# Patient Record
Sex: Female | Born: 1955 | Race: Black or African American | Hispanic: No | Marital: Single | Smoking: Current every day smoker
Health system: Southern US, Community
[De-identification: ages and names within clinical notes are randomized; demographics above are authoritative.]

## PROBLEM LIST (undated history)

## (undated) DIAGNOSIS — I10 Essential (primary) hypertension: Secondary | ICD-10-CM

## (undated) DIAGNOSIS — E119 Type 2 diabetes mellitus without complications: Secondary | ICD-10-CM

---

## 2021-06-13 ENCOUNTER — Other Ambulatory Visit: Payer: Self-pay

## 2021-06-13 ENCOUNTER — Emergency Department (HOSPITAL_BASED_OUTPATIENT_CLINIC_OR_DEPARTMENT_OTHER): Payer: Self-pay

## 2021-06-13 ENCOUNTER — Encounter (HOSPITAL_BASED_OUTPATIENT_CLINIC_OR_DEPARTMENT_OTHER): Payer: Self-pay | Admitting: Emergency Medicine

## 2021-06-13 ENCOUNTER — Emergency Department (HOSPITAL_BASED_OUTPATIENT_CLINIC_OR_DEPARTMENT_OTHER)
Admission: EM | Admit: 2021-06-13 | Discharge: 2021-06-13 | Disposition: A | Discharge status: Home / Self Care | Attending: Emergency Medicine | Admitting: Emergency Medicine

## 2021-06-13 DIAGNOSIS — S6991XA Unspecified injury of right wrist, hand and finger(s), initial encounter: Secondary | ICD-10-CM | POA: Diagnosis present

## 2021-06-13 DIAGNOSIS — W230XXA Caught, crushed, jammed, or pinched between moving objects, initial encounter: Secondary | ICD-10-CM | POA: Diagnosis not present

## 2021-06-13 DIAGNOSIS — S62639A Displaced fracture of distal phalanx of unspecified finger, initial encounter for closed fracture: Secondary | ICD-10-CM

## 2021-06-13 DIAGNOSIS — Y990 Civilian activity done for income or pay: Secondary | ICD-10-CM | POA: Insufficient documentation

## 2021-06-13 DIAGNOSIS — S62614A Displaced fracture of proximal phalanx of right ring finger, initial encounter for closed fracture: Secondary | ICD-10-CM | POA: Diagnosis not present

## 2021-06-13 HISTORY — DX: Essential (primary) hypertension: I10

## 2021-06-13 HISTORY — DX: Type 2 diabetes mellitus without complications: E11.9

## 2021-06-13 NOTE — ED Notes (Signed)
Splint applied to Right Ring Finger as per ED PA orders. Instructed client on proper wear of splint, provided additional tape product to client. AVS then reviewed with client on follow up care with Ortho MD or with Sports Medicine MD here at [facility]. Pain management also discussed. Opportunity for questions provided prior to DC to home.

## 2021-06-13 NOTE — ED Notes (Signed)
After splint application, client did state some improvement, decrease in pain.

## 2021-06-13 NOTE — Discharge Instructions (Signed)
Follow up with orthopedics. Dr. Yehuda Budd is the hand specialist in [place]. Dr. Jordan Likes is a sports medicine doctor at this hospital. You can ice and elevate the hand for 20 minutes at a time. You can take Motrin and Tylenol as needed as directed for pain.

## 2021-06-13 NOTE — ED Triage Notes (Signed)
Pt arrives pov, steady gait, c/o right 4th digit injury at work today. Bruising noted

## 2021-06-13 NOTE — ED Provider Notes (Signed)
[facility] EMERGENCY DEPARTMENT Provider Note   CSN: [id number] Arrival date & time: 06/13/21  1526     History  Chief Complaint  Patient presents with   Finger Injury    Tela Landero is a 66 y.o. female.  66 year old female presents with right fourth finger injury which occurred at work today.  Patient is right-hand dominant, was lifting something at work when it smashed her right fourth finger.  Pain is isolated to the distal phalanx.  Skin is intact.  No other injuries, complaints, concerns.      Home Medications Prior to Admission medications   Not on File      Allergies    Patient has no allergy information on record.    Review of Systems   Review of Systems Negative except as per HPI Physical Exam Updated Vital Signs BP 136/80 (BP Location: Left Arm)   Pulse 89   Temp 98.8 F (37.1 C) (Oral)   Resp 18   Ht 5\' 3"  (1.6 m)   Wt 63.5 kg   SpO2 95%   BMI 24.80 kg/m  Physical Exam Vitals and nursing note reviewed.  Constitutional:      General: She is not in acute distress.    Appearance: She is well-developed. She is not diaphoretic.  HENT:     Head: Normocephalic and atraumatic.  Cardiovascular:     Pulses: Normal pulses.  Pulmonary:     Effort: Pulmonary effort is normal.  Musculoskeletal:        General: Swelling and tenderness present. No deformity.     Comments: Swelling, tenderness, ecchymosis noted to the right fourth distal phalanx, skin intact.  Small subungual hematoma noted.  Sensation intact, brisk capillary fill present.  Skin:    General: Skin is warm and dry.     Findings: Bruising present. No erythema or rash.  Neurological:     Mental Status: She is alert and oriented to person, place, and time.     Sensory: No sensory deficit.     Motor: No weakness.  Psychiatric:        Behavior: Behavior normal.    ED Results / Procedures / Treatments   Labs (all labs ordered are listed, but only abnormal results are displayed) Labs  Reviewed - No data to display  EKG None  Radiology DG Finger Ring Right  Result Date: 06/13/2021 CLINICAL DATA:  Fourth digit injury, bruising EXAM: RIGHT RING FINGER 2+V COMPARISON:  None Available. FINDINGS: Frontal, oblique, lateral views of the right fourth digit are obtained. There is a mildly comminuted fracture of the distal tuft of the fourth distal phalanx, with anatomic alignment. Overlying soft tissue swelling. No other acute bony abnormalities. Joint space narrowing and osteophyte formation of the interphalangeal joints. IMPRESSION: 1. Mildly comminuted fracture distal tuft fourth distal phalanx, with near anatomic alignment. 2. Osteoarthritis. 3. Soft tissue swelling. Electronically Signed   By: Randa Ngo M.D.   On: 06/13/2021 16:20    Procedures Procedures    Medications Ordered in ED Medications - No data to display  ED Course/ Medical Decision Making/ A&P                           Medical Decision Making Amount and/or Complexity of Data Reviewed Radiology: ordered.   66 year old right-hand-dominant female with right fourth finger injury as above.  X-ray is ordered and interpreted by me as tuft fracture distal fourth phalanx, agree with radiologist interpretation.  Plan is to splint to the finger.  She can ice and elevate, take Motrin and Tylenol.  Patient is referred to on-call hand Ortho however prefers not to drive into [facility] for follow-up care and for this reason is also given referral to sports medicine at this hospital.  Patient is advised she can also contact her Worker's Comp. provider for a referral closer to her home.        Final Clinical Impression(s) / ED Diagnoses Final diagnoses:  Closed fracture of tuft of distal phalanx of finger    Rx / DC Orders ED Discharge Orders     None         Tacy Learn, PA-C 06/13/21 1643    Tegeler, Gwenyth Allegra, MD 06/13/21 1710

## 2021-06-26 ENCOUNTER — Ambulatory Visit: Payer: Self-pay | Admitting: Family Medicine

## 2022-05-02 ENCOUNTER — Encounter: Payer: Self-pay | Admitting: *Deleted

## 2023-06-16 IMAGING — DX DG FINGER RING 2+V*R*
4 series · 4 of 4 positions shown · non-contrast
Comparison: None Available.

CLINICAL DATA: Fourth digit injury, bruising

EXAM:
RIGHT RING FINGER 2+V

[finger pa]
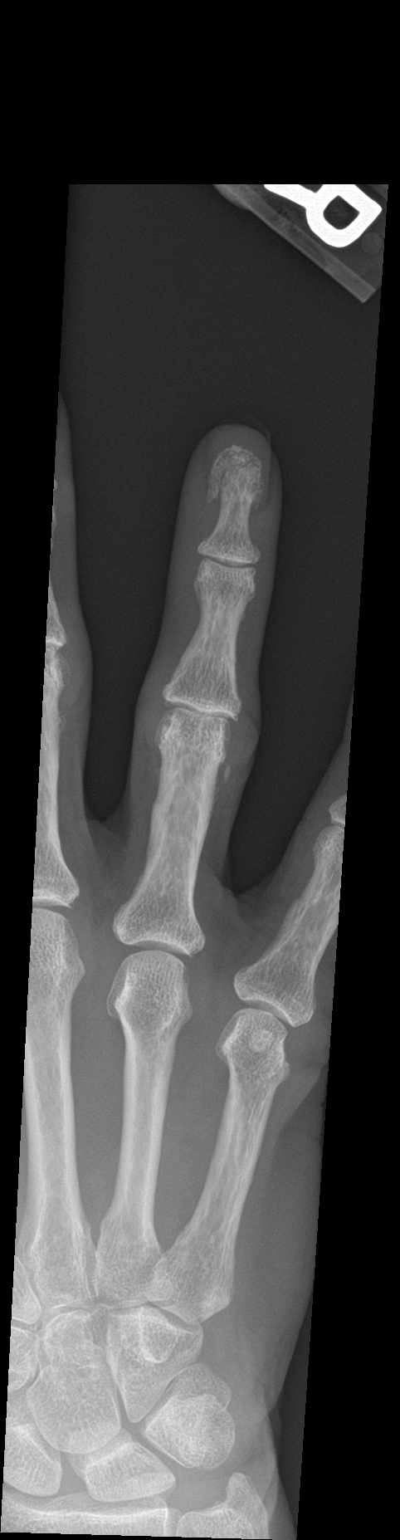

[finger obl]
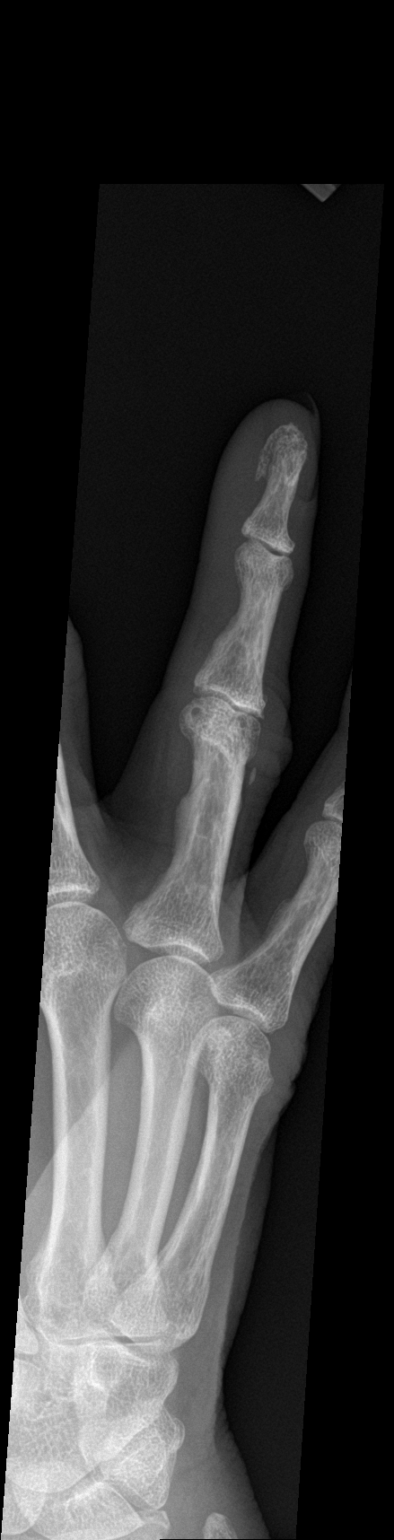

[finger lat (1 of 2)]
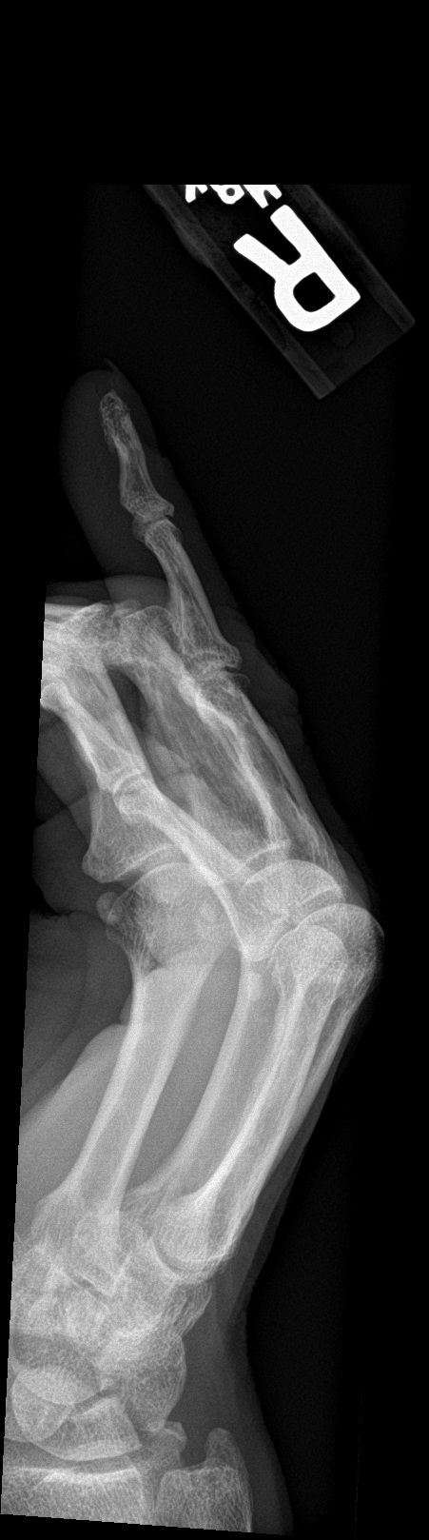

[finger lat (2 of 2)]
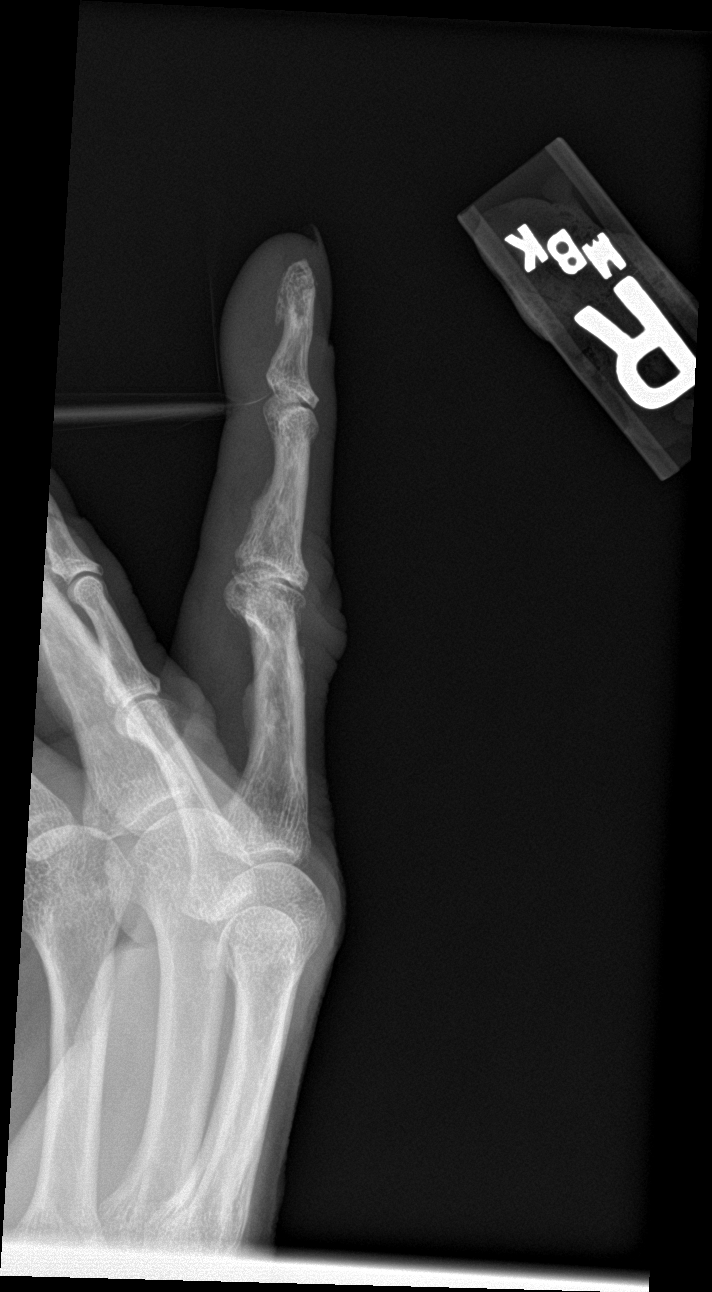

[4 of 4 positions shown; findings below may reference images not displayed]

FINDINGS: Frontal, oblique, lateral views of the right fourth digit are
obtained. There is a mildly comminuted fracture of the distal tuft
of the fourth distal phalanx, with anatomic alignment. Overlying
soft tissue swelling. No other acute bony abnormalities. Joint space
narrowing and osteophyte formation of the interphalangeal joints.
IMPRESSION: 1. Mildly comminuted fracture distal tuft fourth distal phalanx,
with near anatomic alignment.
2. Osteoarthritis.
3. Soft tissue swelling.
